# Patient Record
Sex: Male | Born: 1980 | Race: Black or African American | Hispanic: No | Marital: Married | Smoking: Current some day smoker
Health system: Southern US, Community
[De-identification: ages and names within clinical notes are randomized; demographics above are authoritative.]

---

## 2016-02-28 ENCOUNTER — Encounter: Payer: Self-pay | Admitting: Emergency Medicine

## 2016-02-28 ENCOUNTER — Emergency Department
Admission: EM | Admit: 2016-02-28 | Discharge: 2016-02-28 | Disposition: A | Discharge status: Home / Self Care | Attending: Emergency Medicine | Admitting: Emergency Medicine

## 2016-02-28 ENCOUNTER — Emergency Department

## 2016-02-28 DIAGNOSIS — W51XXXA Accidental striking against or bumped into by another person, initial encounter: Secondary | ICD-10-CM | POA: Insufficient documentation

## 2016-02-28 DIAGNOSIS — Y929 Unspecified place or not applicable: Secondary | ICD-10-CM | POA: Insufficient documentation

## 2016-02-28 DIAGNOSIS — S52502A Unspecified fracture of the lower end of left radius, initial encounter for closed fracture: Secondary | ICD-10-CM | POA: Diagnosis not present

## 2016-02-28 DIAGNOSIS — F17200 Nicotine dependence, unspecified, uncomplicated: Secondary | ICD-10-CM | POA: Insufficient documentation

## 2016-02-28 DIAGNOSIS — Y9364 Activity, baseball: Secondary | ICD-10-CM | POA: Diagnosis not present

## 2016-02-28 DIAGNOSIS — S6992XA Unspecified injury of left wrist, hand and finger(s), initial encounter: Secondary | ICD-10-CM | POA: Diagnosis present

## 2016-02-28 DIAGNOSIS — Y998 Other external cause status: Secondary | ICD-10-CM | POA: Diagnosis not present

## 2016-02-28 DIAGNOSIS — T1490XA Injury, unspecified, initial encounter: Secondary | ICD-10-CM

## 2016-02-28 DIAGNOSIS — IMO0001 Reserved for inherently not codable concepts without codable children: Secondary | ICD-10-CM

## 2016-02-28 MED ORDER — MORPHINE SULFATE (PF) 4 MG/ML IV SOLN
4.0000 mg | Freq: Once | INTRAVENOUS | Status: AC
  Administered 2016-02-28: 4 mg via INTRAVENOUS
  Filled 2016-02-28: qty 1

## 2016-02-28 MED ORDER — HYDROMORPHONE HCL 1 MG/ML IJ SOLN
1.0000 mg | INTRAMUSCULAR | Status: DC | PRN
  Administered 2016-02-28 (×2): 1 mg via INTRAVENOUS
  Filled 2016-02-28 (×2): qty 1

## 2016-02-28 MED ORDER — BUPIVACAINE HCL 0.5 % IJ SOLN
10.0000 mL | Freq: Once | INTRAMUSCULAR | Status: AC
  Administered 2016-02-28: 10 mL
  Filled 2016-02-28: qty 10

## 2016-02-28 MED ORDER — SODIUM CHLORIDE 0.9 % IV BOLUS (SEPSIS)
500.0000 mL | Freq: Once | INTRAVENOUS | Status: AC
  Administered 2016-02-28: 500 mL via INTRAVENOUS

## 2016-02-28 MED ORDER — OXYCODONE-ACETAMINOPHEN 5-325 MG PO TABS: 1.0000 | ORAL_TABLET | ORAL | 0 refills | Status: AC | PRN

## 2016-02-28 MED ORDER — BUPIVACAINE HCL (PF) 0.5 % IJ SOLN
INTRAMUSCULAR | Status: AC
  Filled 2016-02-28: qty 30

## 2016-02-28 MED ORDER — LIDOCAINE HCL (PF) 1 % IJ SOLN
5.0000 mL | Freq: Once | INTRAMUSCULAR | Status: AC
  Administered 2016-02-28: 5 mL
  Filled 2016-02-28: qty 5

## 2016-02-28 NOTE — Discharge Instructions (Signed)
Please keep your splint on at all times until you are evaluated by the hand [facility].  Keep your arm elevated above her heart as much as possible to decrease swelling.  Return to the emergency department if you develop tingling, numbness, color changes, severe pain, or any other symptoms concerning to you.

## 2016-02-28 NOTE — ED Notes (Signed)
Dr. Martha Clankrasinski here to see pt.

## 2016-02-28 NOTE — ED Triage Notes (Signed)
BIB EMS from baseball game where he collided with another player. Pain and deformity noted to left wrist. Brace in place over left wrist Given 125mg  of Fentanyl by EMS prior to arrival

## 2016-02-28 NOTE — Consult Note (Signed)
Patient had undergone a hematoma block by the ER staff physician and placed in finger traps without reduction of the fracture.  I placed a sugar tong splint on the patient tonight, splinting it in it's current position.  I explained to the patient about signs and symptoms of compartment syndrome.  He understands that if he experiences uncontrollable pain, loss of sensation or motion of the fingers of the left hand or if he has poor circulation of his fingers, he is to report to the nearest ER for re-evaluation.  He understands that failure to do so may risk permanent neurovascular and muscular injury.  He will continue to ice and elevate his arm until his [facility]edic [facility] on Monday morning.

## 2016-02-28 NOTE — ED Notes (Signed)
Pt repositioned in bed for comfort. Cap refill to fingers is less than 2 seconds. Pt updated on plan of care. Call bell at side. Pt denies further needs.

## 2016-02-28 NOTE — ED Notes (Signed)
Pt continues to complain of "numbness" to left fingers that has not gotten worse since injury and "my arm feels like it's falling asleep from my elbow to my wrist." dr. Don Perkingveronese again notified and in to reexamine pt.

## 2016-02-28 NOTE — ED Notes (Signed)
Pt provided with crackers and po fluids.

## 2016-02-28 NOTE — ED Provider Notes (Signed)
[facility] Emergency Department Provider Note  ____________________________________________  Time seen: Approximately 6:06 PM  I have reviewed the triage vital signs and the nursing notes.   HISTORY  Chief Complaint Wrist Pain   HPI Marc Khan is a 35 y.o. R handed male no significant past medical history who presents for evaluation of deformity of his left wrist. Patient reports that he was playing softball when he collided with another player. He felt immediate severe pain in his left wrist and noticed a deformity. He reports that his pain is severe, worse with any movement of the wrist, constant since the accident which happened just PTA. He received 125 g of fentanyl per EMS prior to arrival. Patient denies head injury, LOC, neck pain, back pain, any other injuries.  History reviewed. No pertinent past medical history.  There are no active problems to display for this patient.   History reviewed. No pertinent surgical history.  Prior to Admission medications   Medication Sig Start Date End Date Taking? Authorizing Provider  oxyCODONE-acetaminophen (ROXICET) 5-325 MG tablet Take 1-2 tablets by mouth every 4 (four) hours as needed. 02/28/16 02/27/17  Rockne MenghiniAnne-Caroline Norman, MD    Allergies Review of patient's allergies indicates no known allergies.  No family history on file.  Social History Social History  Substance Use Topics  . Smoking status: Current Some Day Smoker  . Smokeless tobacco: Not on file  . Alcohol use Yes    Review of Systems  Constitutional: Negative for fever. Eyes: Negative for visual changes. ENT: Negative for sore throat. Cardiovascular: Negative for chest pain. Respiratory: Negative for shortness of breath. Gastrointestinal: Negative for abdominal pain, vomiting or diarrhea. Genitourinary: Negative for dysuria. Musculoskeletal: Negative for back pain. + Left wrist pain and deformity Skin: Negative for  rash. Neurological: Negative for headaches, weakness or numbness.  ____________________________________________   PHYSICAL EXAM:  VITAL SIGNS: ED Triage Vitals  Enc Vitals Group     BP 02/28/16 1800 134/76     Pulse Rate 02/28/16 1800 84     Resp 02/28/16 1800 18     Temp 02/28/16 1800 98.3 F (36.8 C)     Temp Source 02/28/16 1800 Oral     SpO2 02/28/16 1800 97 %     Weight 02/28/16 1800 194 lb (88 kg)     Height 02/28/16 1800 5\' 7"  (1.702 m)     Head Circumference --      Peak Flow --      Pain Score 02/28/16 1801 8     Pain Loc --      Pain Edu? --      Excl. in GC? --     Constitutional: Alert and oriented. No acute distress. Does not appear intoxicated. HEENT Head: Normocephalic and atraumatic. Face: No facial bony tenderness. Stable midface Ears: No hemotympanum bilaterally. No Battle sign Eyes: No eye injury. PERRL. No raccoon eyes Nose: Nontender. No epistaxis. No rhinorrhea Mouth/Throat: Mucous membranes are moist. No oropharyngeal blood. No dental injury. Airway patent without stridor. Normal voice. Neck: C-collar in place. No midline c-spine tenderness.  Cardiovascular: Normal rate, regular rhythm. Normal and symmetric distal pulses are present in all extremities. Pulmonary/Chest: Chest wall is stable and nontender to palpation/compression. Normal respiratory effort. Breath sounds are normal. No crepitus.  Abdominal: Soft, nontender, non distended. Musculoskeletal: Obvious deformity to the left wrist, skin intact, strong radial pulse and brisk capillary refill, motor exam limited due to pain however patient seems to have intact neurovascular exam as  Patient is able to give me a thumbs up, okay sign, able to spread his fingers, normal sensation to light touch on the distributions of the ulnar radius and median nerve.. Mild TTP over the left elbow with no deformity. Nontender with normal full range of motion in all other joint. No thoracic or lumbar midline spinal  tenderness. Pelvis is stable. Skin: Skin is warm, dry and intact. No abrasions or contutions. Psychiatric: Speech and behavior are appropriate. Neurological: Normal speech and language. Moves all extremities to command. No gross focal neurologic deficits are appreciated.  Glascow Coma Score: 4 - Opens eyes on own 6 - Follows simple motor commands 5 - Alert and oriented GCS: 15   ____________________________________________   LABS (all labs ordered are listed, but only abnormal results are displayed)  Labs Reviewed - No data to display ____________________________________________  EKG  none ____________________________________________  RADIOLOGY  XR: Comminuted fracture involves the distal aspect of the radius. 2. Distal ulnar dislocation. ____________________________________________   PROCEDURES  Procedure(s) performed:yes Procedures   Hematoma block  5 ML 0.5% bupivacaine and 5 mL of 1% lidocaine without epi were injected into the fracture after blood was obtained on aspiration with improvement of the pain. Patient tolerated procedure well. Forearm was then placed in traction  Critical Care performed:  None ____________________________________________   INITIAL IMPRESSION / ASSESSMENT AND PLAN / ED COURSE   35 y.o. R handed male no significant past medical history who presents for evaluation of deformity of his left wrist. Presentation concerning for closed fracture. XR pending. Neurovascular exam limited due to pain however seems intact. No lacerations. Strong pulses. Will give morphine for pain.   Clinical Course   _________________________ 7:34 PM on 02/28/2016 -----------------------------------------  XR showing Comminuted fracture involving the distal radius with distal ulnar dislocation. Spoke with Dr. Martha Clan, Orthopedics who is in the OR but will come down to evaluate patient. I performed a hematoma block with IV lidocaine and bupivacaine with  improvement of the pain. Hand was placed on traction.   _________________________ 9:00 PM on 02/28/2016 ----------------------------------------- Dr. Martha Clan at the bedside evaluating patient. Care transferred to Dr. Sharma Covert.  Pertinent labs & imaging results that were available during my care of the patient were reviewed by me and considered in my medical decision making (see chart for details).    ____________________________________________   FINAL CLINICAL IMPRESSION(S) / ED DIAGNOSES  Final diagnoses:  Trauma  Distal radius fracture, left, closed, initial encounter  ECU (extensor carpi ulnaris), subluxation/dislocation, left, initial encounter      NEW MEDICATIONS STARTED DURING THIS VISIT:  Discharge Medication List as of 02/28/2016  9:50 PM    START taking these medications   Details  oxyCODONE-acetaminophen (ROXICET) 5-325 MG tablet Take 1-2 tablets by mouth every 4 (four) hours as needed., Starting Sat 02/28/2016, Until Sun 02/27/2017, Print         Note:  This document was prepared using Dragon voice recognition software and may include unintentional dictation errors.    Nita Sickle, MD 02/29/16 1116

## 2016-02-28 NOTE — ED Notes (Signed)
Pt complains of "my arm is falling asleep." pt complains of slight numbness to left fingers since injury. Dr. Donna Christenvernese aware.

## 2016-02-28 NOTE — ED Notes (Signed)
Report given to April, RN

## 2016-02-28 NOTE — ED Notes (Signed)
Pt reports continued improved pain. Call bell at right side. Pt watching tv in no acute distress. Cap refill to digits less than 2 seconds.
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
